# Patient Record
Sex: Male | Born: 1955 | Race: White | Hispanic: No | Marital: Married | Smoking: Current every day smoker
Health system: Southern US, Community
[De-identification: ages and names within clinical notes are randomized; demographics above are authoritative.]

## PROBLEM LIST (undated history)

## (undated) HISTORY — PX: HERNIA REPAIR: SHX51

## (undated) HISTORY — PX: JOINT REPLACEMENT: SHX530

---

## 2013-08-22 DEATH — deceased

## 2017-08-31 ENCOUNTER — Other Ambulatory Visit: Payer: Self-pay | Admitting: Pediatrics

## 2017-08-31 ENCOUNTER — Ambulatory Visit
Admission: RE | Admit: 2017-08-31 | Discharge: 2017-08-31 | Disposition: A | Discharge status: Home / Self Care | Payer: Disability Insurance | Source: Ambulatory Visit | Attending: Pediatrics | Admitting: Pediatrics

## 2017-08-31 DIAGNOSIS — M5416 Radiculopathy, lumbar region: Secondary | ICD-10-CM

## 2018-10-17 ENCOUNTER — Emergency Department: Payer: BLUE CROSS/BLUE SHIELD

## 2018-10-17 ENCOUNTER — Emergency Department
Admission: EM | Admit: 2018-10-17 | Discharge: 2018-10-17 | Disposition: A | Discharge status: Home / Self Care | Payer: BLUE CROSS/BLUE SHIELD | Attending: Emergency Medicine | Admitting: Emergency Medicine

## 2018-10-17 ENCOUNTER — Other Ambulatory Visit: Payer: Self-pay

## 2018-10-17 ENCOUNTER — Encounter: Payer: Self-pay | Admitting: Emergency Medicine

## 2018-10-17 DIAGNOSIS — N201 Calculus of ureter: Secondary | ICD-10-CM | POA: Diagnosis not present

## 2018-10-17 DIAGNOSIS — R1032 Left lower quadrant pain: Secondary | ICD-10-CM | POA: Diagnosis present

## 2018-10-17 DIAGNOSIS — F17200 Nicotine dependence, unspecified, uncomplicated: Secondary | ICD-10-CM | POA: Insufficient documentation

## 2018-10-17 LAB — CBC
HCT: 47.2 % (ref 39.0–52.0)
Hemoglobin: 15.5 g/dL (ref 13.0–17.0)
MCH: 28.6 pg (ref 26.0–34.0)
MCHC: 32.8 g/dL (ref 30.0–36.0)
MCV: 87.1 fL (ref 80.0–100.0)
NRBC: 0 % (ref 0.0–0.2)
PLATELETS: 404 10*3/uL — AB (ref 150–400)
RBC: 5.42 MIL/uL (ref 4.22–5.81)
RDW: 14.1 % (ref 11.5–15.5)
WBC: 15.9 10*3/uL — AB (ref 4.0–10.5)

## 2018-10-17 LAB — BASIC METABOLIC PANEL
ANION GAP: 7 (ref 5–15)
BUN: 22 mg/dL (ref 8–23)
CALCIUM: 9.2 mg/dL (ref 8.9–10.3)
CO2: 27 mmol/L (ref 22–32)
CREATININE: 1.21 mg/dL (ref 0.61–1.24)
Chloride: 106 mmol/L (ref 98–111)
Glucose, Bld: 101 mg/dL — ABNORMAL HIGH (ref 70–99)
Potassium: 3.9 mmol/L (ref 3.5–5.1)
Sodium: 140 mmol/L (ref 135–145)

## 2018-10-17 MED ORDER — HYDROCODONE-ACETAMINOPHEN 5-325 MG PO TABS: 1.0000 | ORAL_TABLET | Freq: Four times a day (QID) | ORAL | 0 refills | Status: AC | PRN

## 2018-10-17 NOTE — ED Triage Notes (Signed)
Pt to ED [facility] from [facility] for evaluation of left flank pain and emesis. Pt states that they did a urine test and told him that he had blood in his urine and protein. Pt was given shot for pain and nausea but is unsure what medication he was given. Pt is in NAD at this time.

## 2018-10-17 NOTE — ED Provider Notes (Signed)
[facility] Emergency Department Provider Note ____________________________________________   First MD Initiated Contact with Patient 10/17/18 1833     (approximate)  I have reviewed the triage vital signs and the nursing notes.   HISTORY  Chief Complaint Flank Pain and Emesis    HPI Kristopher Berry is a 62 y.o. male with PMH as noted below who presents with left flank pain, acute onset approximately 15 hours ago, associated with nausea and multiple episodes of vomiting, now subsided after he got some pain medication.  He states that it radiates between the left flank and left lower quadrant.  He denies any prior history of this pain.  He denies any specific urinary symptoms.  The patient went to urgent care and was told he had blood in the urine was given some pain and nausea medication.  He was then instructed to go to the emergency department for further evaluation.  History reviewed. No pertinent past medical history.  There are no active problems to display for this patient.   Past Surgical History:  Procedure Laterality Date  . HERNIA REPAIR    . JOINT REPLACEMENT      Prior to Admission medications   Medication Sig Start Date End Date Taking? Authorizing Provider  HYDROcodone-acetaminophen (NORCO/VICODIN) 5-325 MG tablet Take 1 tablet by mouth every 6 (six) hours as needed for up to 3 days for severe pain. 10/17/18 10/20/18  Dionne Bucy, MD    Allergies Patient has no known allergies.  No family history on file.  Social History Social History   Tobacco Use  . Smoking status: Current Every Day Smoker  . Smokeless tobacco: Never Used  Substance Use Topics  . Alcohol use: Never    Frequency: Never  . Drug use: Never    Review of Systems  Constitutional: No fever. Eyes: No redness. ENT: No sore throat. Cardiovascular: Denies chest pain. Respiratory: Denies shortness of breath. Gastrointestinal: Positive for nausea and  vomiting.  Genitourinary: Negative for dysuria.  Positive for flank pain. Musculoskeletal: Negative for back pain. Skin: Negative for rash. Neurological: Negative for headache.   ____________________________________________   PHYSICAL EXAM:  VITAL SIGNS: ED Triage Vitals [10/17/18 1542]  Enc Vitals Group     BP (!) 148/91     Pulse Rate 65     Resp 16     Temp 98.5 F (36.9 C)     Temp Source Oral     SpO2 99 %     Weight      Height      Head Circumference      Peak Flow      Pain Score 8     Pain Loc      Pain Edu?      Excl. in GC?     Constitutional: Alert and oriented.  Ultimately well appearing and in no acute distress. Eyes: Conjunctivae are normal.  Head: Atraumatic. Nose: No congestion/rhinnorhea. Mouth/Throat: Mucous membranes are moist.   Neck: Normal range of motion.  Cardiovascular: Good peripheral circulation. Respiratory: Normal respiratory effort. Gastrointestinal: Soft and nontender. No distention.  Genitourinary: Mild left flank tenderness.  No CVA tenderness. Musculoskeletal:  Extremities warm and well perfused.  Neurologic:  Normal speech and language. No gross focal neurologic deficits are appreciated.  Skin:  Skin is warm and dry. No rash noted. Psychiatric: Mood and affect are normal. Speech and behavior are normal.  ____________________________________________   LABS (all labs ordered are listed, but only abnormal results are displayed)  Labs Reviewed  CBC - Abnormal; Notable for the following components:      Result Value   WBC 15.9 (*)    Platelets 404 (*)    All other components within normal limits  BASIC METABOLIC PANEL - Abnormal; Notable for the following components:   Glucose, Bld 101 (*)    All other components within normal limits  URINALYSIS, COMPLETE (UACMP) WITH MICROSCOPIC   ____________________________________________  EKG   ____________________________________________  RADIOLOGY  CT abdomen: 3 mm left UVJ  stone.  Possible lytic lesion in the right acetabulum.  ____________________________________________   PROCEDURES  Procedure(s) performed: No  Procedures  Critical Care performed: No ____________________________________________   INITIAL IMPRESSION / ASSESSMENT AND PLAN / ED COURSE  Pertinent labs & imaging results that were available during my care of the patient were reviewed by me and considered in my medical decision making (see chart for details).  62 year old male with PMH as noted above presents with acute onset of left flank and left lower quadrant pain with nausea and vomiting early this morning.  The patient was evaluated at urgent care and found to have hematuria.  He was then referred to the emergency department.  Here he is comfortable appearing after the medications he got in urgent care.  His vital signs are normal.  He has mild left flank tenderness.  The presentation is most consistent with ureteral stone versus less likely pyelonephritis.  We will obtain a CT abdomen is this patient has no prior history of stones, basic labs, and a UA and reassess.  ----------------------------------------- 8:41 PM on 10/17/2018 -----------------------------------------  The patient has remained pain-free throughout his ED stay.  The CT shows a small ureteral stone at the left UVJ and some hydronephrosis.  This should likely pass on its own.  Since the patient's pain is well controlled he is stable for discharge.  The CT also revealed a possible lytic lesion in the right hip and concern for a fracture in the right superior pubic ramus.  On further discussion with the patient he has no pain there whatsoever and is bearing weight normally.  Overall I am not sure of the clinical significance of this finding.  It seems unlikely that he is actually having any acute fracture.  I informed him of this finding and instructed him that he needs to follow-up with an orthopedist within the next  several weeks to have this addressed.  He understands the plan.  Return precautions given, and he expressed understanding of these as well.  ____________________________________________   FINAL CLINICAL IMPRESSION(S) / ED DIAGNOSES  Final diagnoses:  Ureteral stone      NEW MEDICATIONS STARTED DURING THIS VISIT:  New Prescriptions   HYDROCODONE-ACETAMINOPHEN (NORCO/VICODIN) 5-325 MG TABLET    Take 1 tablet by mouth every 6 (six) hours as needed for up to 3 days for severe pain.     Note:  This document was prepared using Dragon voice recognition software and may include unintentional dictation errors.   Dionne Bucy, MD 10/17/18 2043

## 2018-10-17 NOTE — Discharge Instructions (Signed)
Continue taking your Aleve for pain.  You may take the prescribed Vicodin as needed for more severe pain.  Return to the ER for new, worsening, persistent flank pain, vomiting, fever, pain not relieved with the prescribed medication, or any other new or worsening symptoms that concern you.  Your CAT scan shows what is called a lytic lesion in the right hip and pelvis area near your old hip replacement surgery.  Since you are not having pain there, this may be an incidental finding.  You should follow-up with an orthopedist within the next several weeks for further evaluation and to decide if you need any other testing.

## 2019-09-12 IMAGING — CT CT ABD-PELV W/O CM
4 of 7 series · 9 of 46 positions shown, 14 images · non-contrast
Comparison: None.

ADDENDUM:
Thin reconstructed images are performed through the pelvis. In
addition to the above findings, there is lucency within the RIGHT
greater trochanter, adjacent to the hardware.
CLINICAL DATA: Pt to ED via POV from [REDACTED] for evaluation of
left flank pain and emesis. Pt states that they did a urine test and
told him that he had blood in his urine and protein.

EXAM:
CT ABDOMEN AND PELVIS WITHOUT CONTRAST
TECHNIQUE: Multidetector CT imaging of the abdomen and pelvis was performed
following the standard protocol without IV contrast.

[Series 2: axial st · axial · 0.96mm/px · z∈[-423,-253]mm · 2 of 102 slices shown, 5 images]
[im 34/102  soft-tissue]
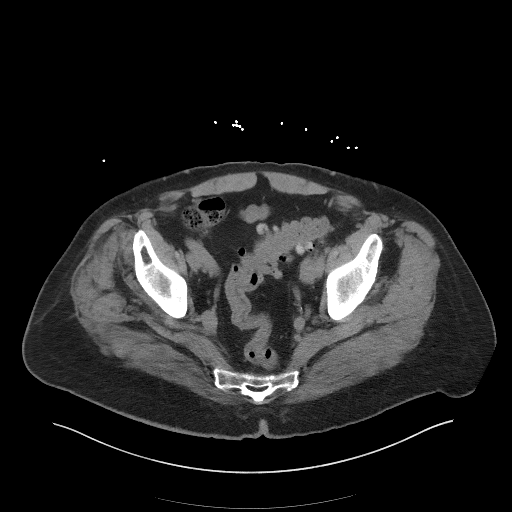
[im 34/102  lung]
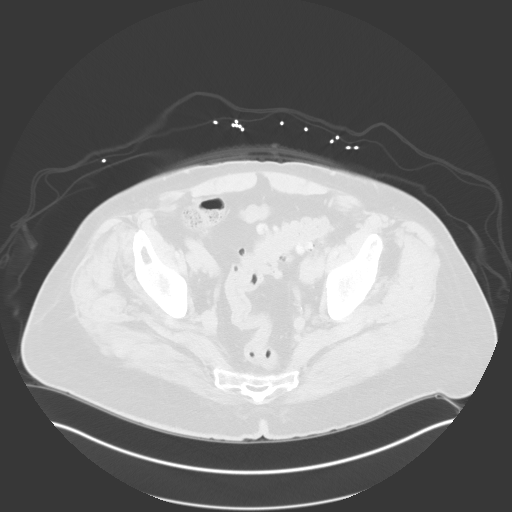
[im 34/102  bone]
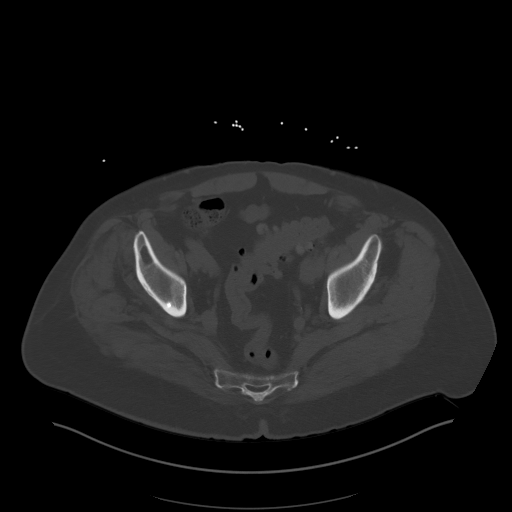
[im 68/102  soft-tissue]
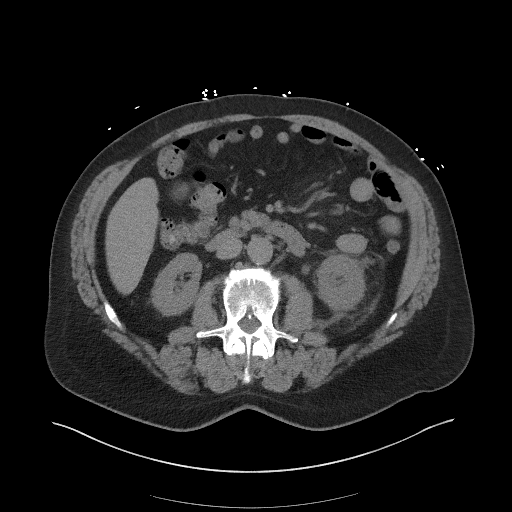
[im 68/102  lung]
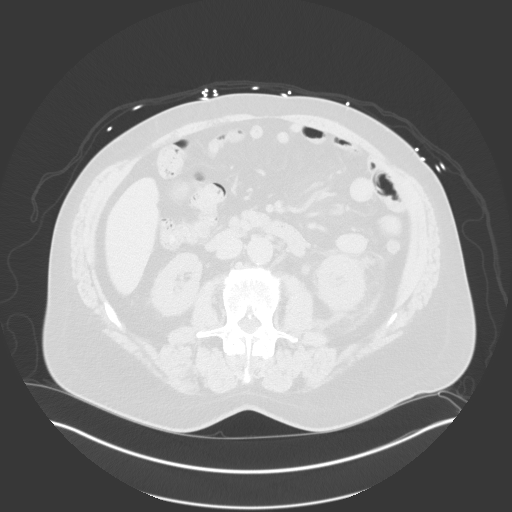

[Series 7: axial thin bone pelvis · axial · 0.47mm/px · z∈[-567,-495]mm · 3 of 628 slices shown]
[im 49/628  bone]
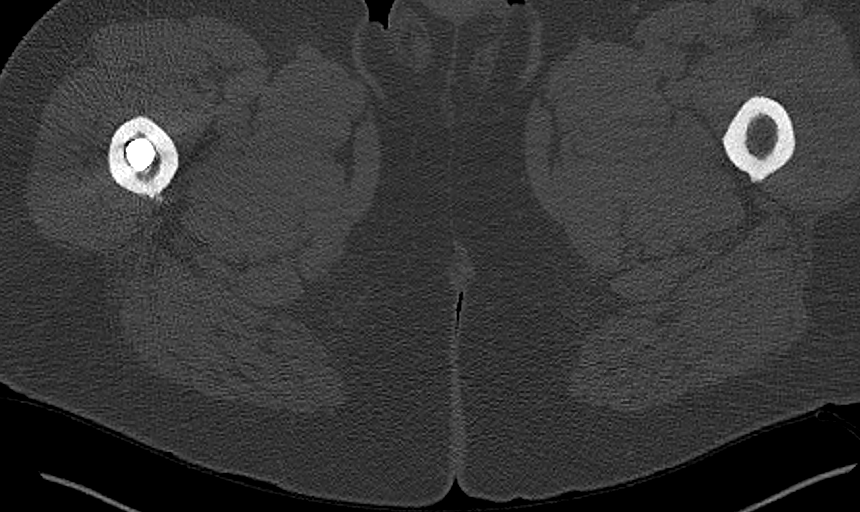
[im 121/628  bone]
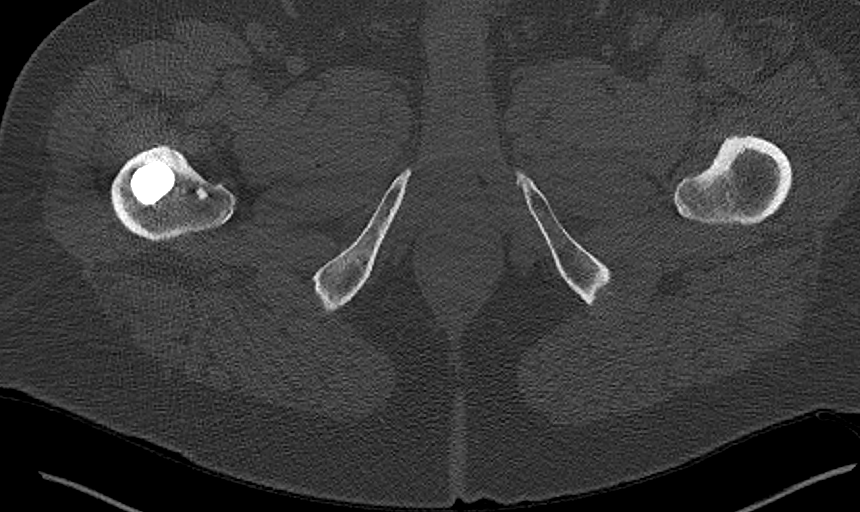
[im 193/628  bone]
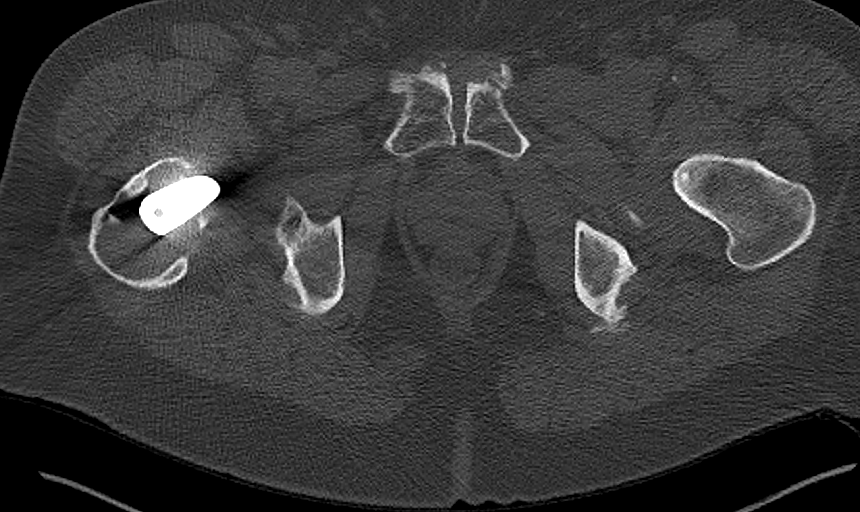

[Series 8: cor thin bone pelvis · coronal · 0.62mm/px · 3 of 483 slices shown, 4 images]
[im 138/483  soft-tissue]
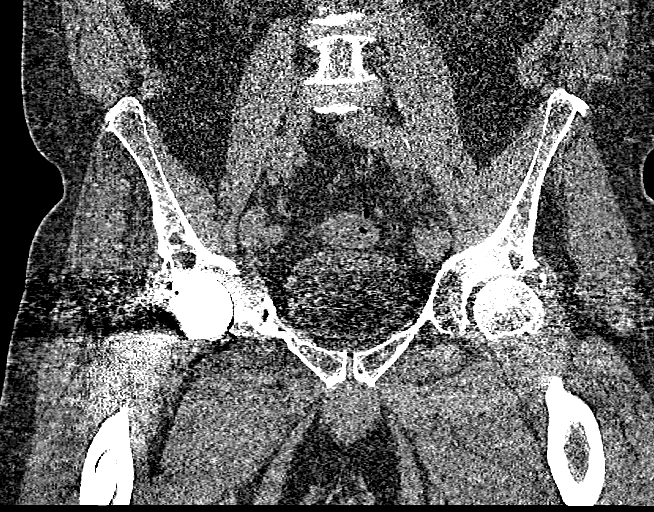
[im 207/483  soft-tissue]
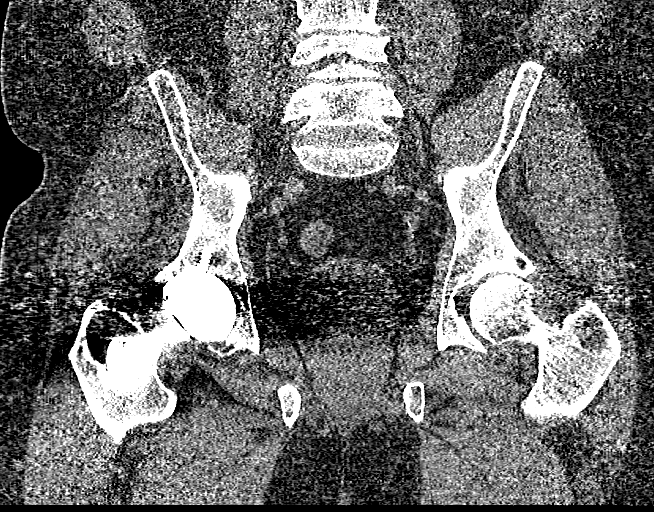
[im 207/483  bone]
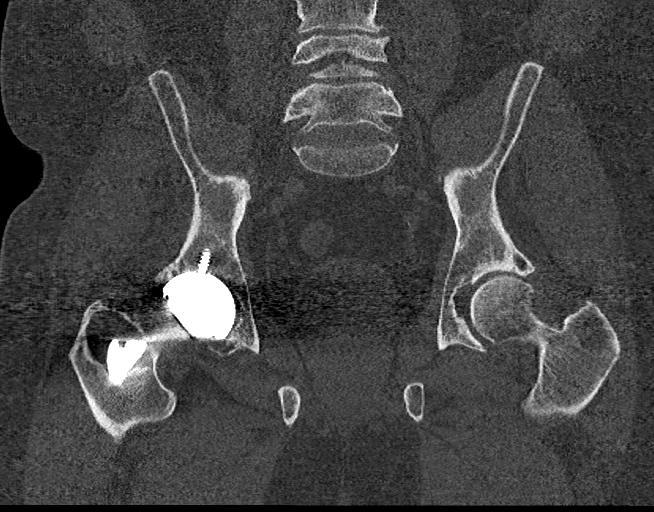
[im 276/483  soft-tissue]
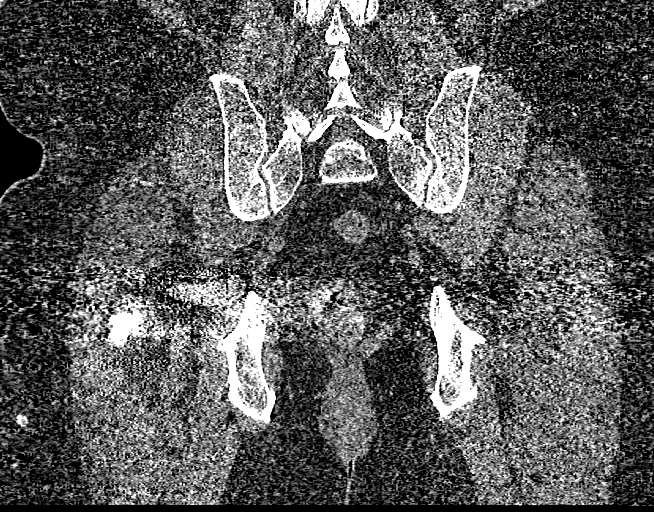

[Series 11: sag thin bone pelvis-- · sagittal · 0.47mm/px · 1 of 735 slices shown, 2 images]
[im 368/735  soft-tissue]
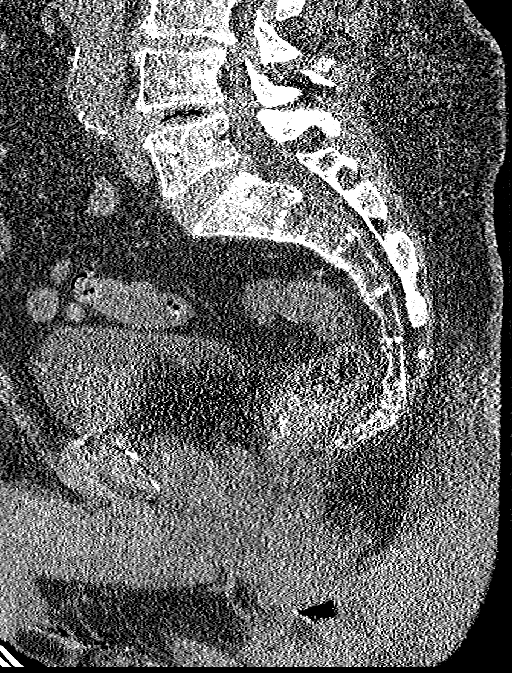
[im 368/735  bone]
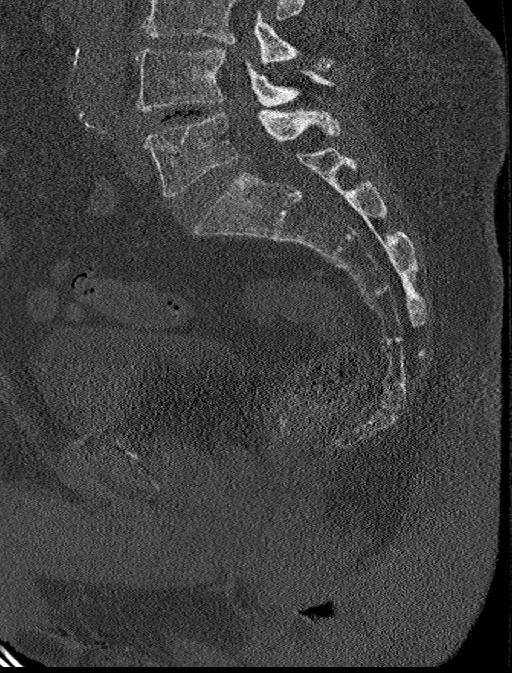

[9 of 46 positions shown; findings below may reference images not displayed]

FINDINGS: Lower chest: No acute abnormality.  Elevated LEFT hemidiaphragm.

Hepatobiliary: No focal liver abnormality is seen. No radiopaque
gallstones, biliary dilatation, or pericholecystic inflammatory
changes.

Pancreas: Unremarkable. No pancreatic ductal dilatation or
surrounding inflammatory changes.

Spleen: Normal in size without focal abnormality.

Adrenals/Urinary Tract: Adrenal glands are normal in appearance.

There is hydronephrosis and perinephric stranding of the LEFT
kidney. The LEFT ureter is dilated to level of the bladder. There is
a 3 millimeter calcification likely within the distal most aspect of
the LEFT ureterovesical junction. Detail is limited secondary to
metallic artifact in the pelvis. No LEFT renal lesion.

The RIGHT kidney and ureter are unremarkable. The visualized portion
of the urethra is normal.

Stomach/Bowel: The stomach and small bowel loops are normal in
appearance. There is colonic diverticulosis, particularly involving
the sigmoid colon. No associated inflammatory changes. The appendix
is well seen and has a normal appearance.

Vascular/Lymphatic: There is atherosclerotic calcification of the
abdominal aorta no associated aneurysm. No retroperitoneal or
mesenteric adenopathy.

Reproductive: The prostate gland is normal in appearance. There are
clips within the scrotal region bilaterally. Question prior
orchiopexy.

Other: Small fat containing paraumbilical hernia.

Musculoskeletal: Patient has had RIGHT hip total arthroplasty. There
is question of an expansile lesion involving the MEDIAL wall of the
RIGHT acetabulum, extending into the RIGHT superior pubic ramus.
Question of pathologic fracture along the posterior aspect of the
RIGHT superior pubic ramus. There is loss of the cortical margin
along the anteromedial acetabulum.

Hardware appears intact.

There are degenerative changes in the LEFT hip.
IMPRESSION: 1. Obstructing 3 millimeter calculus in the LEFT ureterovesical
junction. LEFT hydronephrosis and perinephric stranding.
2. Status post RIGHT hip arthroplasty. There is question of an
expansile lytic lesion involving the MEDIAL acetabulum and RIGHT
superior pubic ramus. Question of pathologic fracture in this
region. Recommend further evaluation with plain film correlation and
possible orthopedic referral.
3.  Aortic atherosclerosis.  ([id number])
4. Small fat containing paraumbilical hernia.

## 2019-12-01 ENCOUNTER — Other Ambulatory Visit: Payer: Self-pay

## 2019-12-01 DIAGNOSIS — Z20822 Contact with and (suspected) exposure to covid-19: Secondary | ICD-10-CM

## 2019-12-03 LAB — NOVEL CORONAVIRUS, NAA: SARS-CoV-2, NAA: NOT DETECTED

## 2024-05-01 ENCOUNTER — Emergency Department

## 2024-05-01 ENCOUNTER — Emergency Department
Admission: EM | Admit: 2024-05-01 | Discharge: 2024-05-01 | Disposition: A | Discharge status: Home / Self Care | Attending: Emergency Medicine | Admitting: Emergency Medicine

## 2024-05-01 ENCOUNTER — Other Ambulatory Visit: Payer: Self-pay

## 2024-05-01 DIAGNOSIS — I4891 Unspecified atrial fibrillation: Secondary | ICD-10-CM | POA: Diagnosis not present

## 2024-05-01 DIAGNOSIS — Z85118 Personal history of other malignant neoplasm of bronchus and lung: Secondary | ICD-10-CM | POA: Insufficient documentation

## 2024-05-01 DIAGNOSIS — Z7901 Long term (current) use of anticoagulants: Secondary | ICD-10-CM | POA: Insufficient documentation

## 2024-05-01 DIAGNOSIS — E860 Dehydration: Secondary | ICD-10-CM | POA: Diagnosis not present

## 2024-05-01 DIAGNOSIS — R42 Dizziness and giddiness: Secondary | ICD-10-CM

## 2024-05-01 DIAGNOSIS — R0602 Shortness of breath: Secondary | ICD-10-CM | POA: Diagnosis present

## 2024-05-01 LAB — CBC WITH DIFFERENTIAL/PLATELET
Abs Immature Granulocytes: 0.06 10*3/uL (ref 0.00–0.07)
Basophils Absolute: 0 10*3/uL (ref 0.0–0.1)
Basophils Relative: 0 %
Eosinophils Absolute: 0 10*3/uL (ref 0.0–0.5)
Eosinophils Relative: 1 %
HCT: 33.7 % — ABNORMAL LOW (ref 39.0–52.0)
Hemoglobin: 11.3 g/dL — ABNORMAL LOW (ref 13.0–17.0)
Immature Granulocytes: 1 %
Lymphocytes Relative: 14 %
Lymphs Abs: 0.7 10*3/uL (ref 0.7–4.0)
MCH: 30.1 pg (ref 26.0–34.0)
MCHC: 33.5 g/dL (ref 30.0–36.0)
MCV: 89.6 fL (ref 80.0–100.0)
Monocytes Absolute: 0.6 10*3/uL (ref 0.1–1.0)
Monocytes Relative: 12 %
Neutro Abs: 3.5 10*3/uL (ref 1.7–7.7)
Neutrophils Relative %: 72 %
Platelets: 242 10*3/uL (ref 150–400)
RBC: 3.76 MIL/uL — ABNORMAL LOW (ref 4.22–5.81)
RDW: 14.6 % (ref 11.5–15.5)
WBC: 4.9 10*3/uL (ref 4.0–10.5)
nRBC: 0 % (ref 0.0–0.2)

## 2024-05-01 LAB — COMPREHENSIVE METABOLIC PANEL WITH GFR
ALT: 17 U/L (ref 0–44)
AST: 19 U/L (ref 15–41)
Albumin: 3.2 g/dL — ABNORMAL LOW (ref 3.5–5.0)
Alkaline Phosphatase: 51 U/L (ref 38–126)
Anion gap: 7 (ref 5–15)
BUN: 16 mg/dL (ref 8–23)
CO2: 25 mmol/L (ref 22–32)
Calcium: 8.6 mg/dL — ABNORMAL LOW (ref 8.9–10.3)
Chloride: 103 mmol/L (ref 98–111)
Creatinine, Ser: 0.75 mg/dL (ref 0.61–1.24)
GFR, Estimated: >60 mL/min (ref >60)
Glucose, Bld: 98 mg/dL (ref 70–99)
Potassium: 3.9 mmol/L (ref 3.5–5.1)
Sodium: 135 mmol/L (ref 135–145)
Total Bilirubin: 0.6 mg/dL (ref 0.0–1.2)
Total Protein: 6.7 g/dL (ref 6.5–8.1)

## 2024-05-01 LAB — RESP PANEL BY RT-PCR (RSV, FLU A&B, COVID)  RVPGX2
Influenza A by PCR: NEGATIVE
Influenza B by PCR: NEGATIVE
Resp Syncytial Virus by PCR: NEGATIVE
SARS Coronavirus 2 by RT PCR: NEGATIVE

## 2024-05-01 LAB — MAGNESIUM: Magnesium: 1.5 mg/dL — ABNORMAL LOW (ref 1.7–2.4)

## 2024-05-01 LAB — TROPONIN I (HIGH SENSITIVITY)
Troponin I (High Sensitivity): 13 ng/L (ref <18)
Troponin I (High Sensitivity): 16 ng/L (ref <18)

## 2024-05-01 MED ORDER — LACTATED RINGERS IV BOLUS
1000.0000 mL | Freq: Once | INTRAVENOUS | Status: AC
  Administered 2024-05-01: 1000 mL via INTRAVENOUS

## 2024-05-01 MED ORDER — MAGNESIUM SULFATE 2 GM/50ML IV SOLN
2.0000 g | Freq: Once | INTRAVENOUS | Status: AC
Start: 2024-05-01 — End: 2024-05-01
  Administered 2024-05-01: 2 g via INTRAVENOUS
  Filled 2024-05-01: qty 50

## 2024-05-01 MED ORDER — IOHEXOL 350 MG/ML SOLN
75.0000 mL | Freq: Once | INTRAVENOUS | Status: AC | PRN
  Administered 2024-05-01: 75 mL via INTRAVENOUS

## 2024-05-01 NOTE — ED Triage Notes (Signed)
 Pt BIB [facility] from Home for Dizziness and some SOB. Per EMS pt tachy with hx of AFIB and was hypotensive in the 90's systolic. Pt also stating was SOB this morning however feeling easier to breathe at this time. Pt also Lunch [facility] pt, received 2nd dose of chemo and 5 of 6 radiation treatments on Friday. Pt stating received 2 bags of fluid on visit Friday due to dehydration. Received 500mL of fluid by EMS. Vitals 98/66, 99.8 temp, 97% RA

## 2024-05-01 NOTE — ED Notes (Signed)
 Pt returned to bed, O2 at 97%RA, no dizziness.

## 2024-05-01 NOTE — ED Provider Notes (Signed)
 [facility] Provider Note    Event Date/Time   First MD Initiated Contact with Patient 05/01/24 1051     (approximate)   History   Chief Complaint Dizziness   HPI  Kristopher Berry is a 68 y.o. male with past medical history of lung cancer and atrial fibrillation on Eliquis who presents to the ED complaining of dizziness.  Patient reports that he woke up this morning feeling lightheaded and dizzy, especially when attempting to walk.  He was feeling short of breath this morning, but denies any associated chest pain and states shortness of breath has resolved by the time he arrived to the ED.  He denies any fevers or cough, has not had any pain or swelling in his legs.  He does report a history of atrial fibrillation and EMS noted patient to be in A-fib with rate in the 120s.     Physical Exam   Triage Vital Signs: ED Triage Vitals  Encounter Vitals Group     BP      Systolic BP Percentile      Diastolic BP Percentile      Pulse      Resp      Temp      Temp src      SpO2      Weight      Height      Head Circumference      Peak Flow      Pain Score      Pain Loc      Pain Education      Exclude from Growth Chart     Most recent vital signs: Vitals:   05/01/24 1300 05/01/24 1330  BP: 116/75 104/79  Pulse: 84 97  Resp: 19 16  Temp:    SpO2: 100% 99%    Constitutional: Alert and oriented. Eyes: Conjunctivae are normal. Head: Atraumatic. Nose: No congestion/rhinnorhea. Mouth/Throat: Mucous membranes are moist.  Cardiovascular: Tachycardic, irregularly irregular rhythm. Grossly normal heart sounds.  2+ radial pulses bilaterally. Respiratory: Normal respiratory effort.  No retractions. Lungs CTAB. Gastrointestinal: Soft and nontender. No distention. Musculoskeletal: No lower extremity tenderness nor edema.  Neurologic:  Normal speech and language. No gross focal neurologic deficits are appreciated.    ED Results / Procedures /  Treatments   Labs (all labs ordered are listed, but only abnormal results are displayed) Labs Reviewed  CBC WITH DIFFERENTIAL/PLATELET - Abnormal; Notable for the following components:      Result Value   RBC 3.76 (*)    Hemoglobin 11.3 (*)    HCT 33.7 (*)    All other components within normal limits  MAGNESIUM - Abnormal; Notable for the following components:   Magnesium 1.5 (*)    All other components within normal limits  COMPREHENSIVE METABOLIC PANEL WITH GFR - Abnormal; Notable for the following components:   Calcium 8.6 (*)    Albumin 3.2 (*)    All other components within normal limits  RESP PANEL BY RT-PCR (RSV, FLU A&B, COVID)  [id number]  URINALYSIS, ROUTINE W REFLEX MICROSCOPIC  TROPONIN I (HIGH SENSITIVITY)  TROPONIN I (HIGH SENSITIVITY)     EKG  ED ECG REPORT I, Twilla Galea, the attending physician, personally viewed and interpreted this ECG.   Date: 05/01/2024  EKG Time: 10:49  Rate: 111  Rhythm: atrial fibrillation  Axis: LAD  Intervals:none  ST&T Change: None  RADIOLOGY Chest x-ray reviewed and interpreted by me with no infiltrate, edema, or effusion.  PROCEDURES:  Critical Care performed: No  Procedures   MEDICATIONS ORDERED IN ED: Medications  lactated ringers bolus 1,000 mL (0 mLs Intravenous Stopped 05/01/24 1157)  magnesium sulfate IVPB 2 g 50 mL (0 g Intravenous Stopped 05/01/24 1321)  iohexol (OMNIPAQUE) 350 MG/ML injection 75 mL (75 mLs Intravenous Contrast Given 05/01/24 1221)     IMPRESSION / MDM / ASSESSMENT AND PLAN / ED COURSE  I reviewed the triage vital signs and the nursing notes.                              68 y.o. male with past medical history of lung cancer and atrial fibrillation on Eliquis who presents to the ED complaining of lightheadedness and dizziness this morning with some difficulty breathing.  Patient's presentation is most consistent with acute presentation with potential threat to life or bodily  function.  Differential diagnosis includes, but is not limited to, arrhythmia, ACS, PE, pneumonia, anemia, electrolyte abnormality, AKI, sepsis.  Patient nontoxic-appearing and in no acute distress, vital signs remarkable for tachycardia as well as borderline fever at 99.8.  EKG shows atrial fibrillation with RVR, no ischemic changes noted.  We will hydrate with IV fluids but hold off on rate control medication given only mildly elevated rate.  Labs, chest x-ray, and urinalysis are pending at this time.  Labs without significant anemia, leukocytosis, or AKI.  LFTs were unremarkable but patient noted to have hypomagnesemia, which was repleted.  2 sets of troponin are within normal limits and I doubt ACS.  Infectious workup has been unremarkable, chest x-ray showed suspected mass but no evidence of pneumonia.  CTA chest performed that is negative for PE or other acute finding, does demonstrate known lung cancer.  COVID and flu testing is negative, patient denies any urinary symptoms to suggest UTI.  Heart rate improved following IV fluids, patient able to ambulate without any dizziness and is appropriate for discharge home with outpatient cardiology follow-up.  He was counseled to return to the ED for new or worsening symptoms, patient agrees with plan.       FINAL CLINICAL IMPRESSION(S) / ED DIAGNOSES   Final diagnoses:  Dizziness  Atrial fibrillation, unspecified type (HCC)     Rx / DC Orders   ED Discharge Orders     None        Note:  This document was prepared using Dragon voice recognition software and may include unintentional dictation errors.   Twilla Galea, MD 05/01/24 [phone]

## 2024-05-01 NOTE — ED Notes (Signed)
 Pt taken to XR.

## 2024-05-01 NOTE — ED Notes (Signed)
 Pt up to walk. No dizziness present at this time. Pt did need to catch breath after sitting up. Ambulated without difficulty.

## 2024-05-01 NOTE — ED Notes (Signed)
 Pt taken to CT.

## 2024-10-22 DEATH — deceased
